# Patient Record
Sex: Male | Born: 1990 | Race: White | Hispanic: No | Marital: Single | Smoking: Never smoker
Health system: Southern US, Community
[De-identification: ages and names within clinical notes are randomized; demographics above are authoritative.]

## PROBLEM LIST (undated history)

## (undated) DIAGNOSIS — S060X9A Concussion with loss of consciousness of unspecified duration, initial encounter: Secondary | ICD-10-CM

## (undated) DIAGNOSIS — S060XAA Concussion with loss of consciousness status unknown, initial encounter: Secondary | ICD-10-CM

---

## 2014-02-13 ENCOUNTER — Emergency Department (HOSPITAL_COMMUNITY): Payer: BC Managed Care – PPO

## 2014-02-13 ENCOUNTER — Encounter (HOSPITAL_COMMUNITY): Payer: Self-pay | Admitting: Emergency Medicine

## 2014-02-13 ENCOUNTER — Emergency Department (HOSPITAL_COMMUNITY)
Admission: EM | Admit: 2014-02-13 | Discharge: 2014-02-13 | Disposition: A | Discharge status: Home / Self Care | Payer: BC Managed Care – PPO | Attending: Emergency Medicine | Admitting: Emergency Medicine

## 2014-02-13 DIAGNOSIS — S0280XA Fracture of other specified skull and facial bones, unspecified side, initial encounter for closed fracture: Secondary | ICD-10-CM | POA: Insufficient documentation

## 2014-02-13 DIAGNOSIS — W219XXA Striking against or struck by unspecified sports equipment, initial encounter: Secondary | ICD-10-CM | POA: Insufficient documentation

## 2014-02-13 DIAGNOSIS — H11421 Conjunctival edema, right eye: Secondary | ICD-10-CM

## 2014-02-13 DIAGNOSIS — S058X9A Other injuries of unspecified eye and orbit, initial encounter: Secondary | ICD-10-CM | POA: Insufficient documentation

## 2014-02-13 DIAGNOSIS — S0501XS Injury of conjunctiva and corneal abrasion without foreign body, right eye, sequela: Secondary | ICD-10-CM

## 2014-02-13 DIAGNOSIS — S0990XA Unspecified injury of head, initial encounter: Secondary | ICD-10-CM

## 2014-02-13 DIAGNOSIS — Y9362 Activity, american flag or touch football: Secondary | ICD-10-CM | POA: Insufficient documentation

## 2014-02-13 DIAGNOSIS — S0285XA Fracture of orbit, unspecified, initial encounter for closed fracture: Secondary | ICD-10-CM

## 2014-02-13 DIAGNOSIS — Y9239 Other specified sports and athletic area as the place of occurrence of the external cause: Secondary | ICD-10-CM | POA: Insufficient documentation

## 2014-02-13 DIAGNOSIS — H11429 Conjunctival edema, unspecified eye: Secondary | ICD-10-CM | POA: Insufficient documentation

## 2014-02-13 DIAGNOSIS — Y92838 Other recreation area as the place of occurrence of the external cause: Secondary | ICD-10-CM

## 2014-02-13 HISTORY — DX: Concussion with loss of consciousness status unknown, initial encounter: S06.0XAA

## 2014-02-13 HISTORY — DX: Concussion with loss of consciousness of unspecified duration, initial encounter: S06.0X9A

## 2014-02-13 MED ORDER — OXYCODONE-ACETAMINOPHEN 5-325 MG PO TABS
2.0000 | ORAL_TABLET | Freq: Once | ORAL | Status: AC
  Administered 2014-02-13: 2 via ORAL
  Filled 2014-02-13: qty 2

## 2014-02-13 MED ORDER — FLUORESCEIN SODIUM 1 MG OP STRP
1.0000 | ORAL_STRIP | Freq: Once | OPHTHALMIC | Status: DC
  Filled 2014-02-13: qty 1

## 2014-02-13 MED ORDER — TETRACAINE HCL 0.5 % OP SOLN
2.0000 [drp] | Freq: Once | OPHTHALMIC | Status: DC
  Filled 2014-02-13: qty 2

## 2014-02-13 MED ORDER — ONDANSETRON HCL 4 MG PO TABS: 4.0000 mg | ORAL_TABLET | Freq: Four times a day (QID) | ORAL | Status: AC

## 2014-02-13 MED ORDER — OXYCODONE-ACETAMINOPHEN 5-325 MG PO TABS: 1.0000 | ORAL_TABLET | ORAL | Status: AC | PRN

## 2014-02-13 MED ORDER — ERYTHROMYCIN 5 MG/GM OP OINT: TOPICAL_OINTMENT | OPHTHALMIC | Status: AC

## 2014-02-13 NOTE — Discharge Instructions (Signed)
Facial Fracture A facial fracture is a break in one of the bones of your face. HOME CARE INSTRUCTIONS   Protect the injured part of your face until it is healed.  Do not participate in activities which give chance for re-injury until your doctor approves.  Gently wash and dry your face.  Wear head and facial protection while riding a bicycle, motorcycle, or snowmobile. SEEK MEDICAL CARE IF:   An oral temperature above 102 F (38.9 C) develops.  You have severe headaches or notice changes in your vision.  You have new numbness or tingling in your face.  You develop nausea (feeling sick to your stomach), vomiting or a stiff neck. SEEK IMMEDIATE MEDICAL CARE IF:   You develop difficulty seeing or experience double vision.  You become dizzy, lightheaded, or faint.  You develop trouble speaking, breathing, or swallowing.  You have a watery discharge from your nose or ear. MAKE SURE YOU:   Understand these instructions.  Will watch your condition.  Will get help right away if you are not doing well or get worse. Document Released: 08/06/2005 Document Revised: 10/29/2011 Document Reviewed: 03/25/2008 [facility] Patient Information 2015 [place], [place]. This information is not intended to replace advice given to you by your health care provider. Make sure you discuss any questions you have with your health care provider.  Head Injury You have received a head injury. It does not appear serious at this time. Headaches and vomiting are common following head injury. It should be easy to awaken from sleeping. Sometimes it is necessary for you to stay in the emergency department for a while for observation. Sometimes admission to the hospital may be needed. After injuries such as yours, most problems occur within the first 24 hours, but side effects may occur up to 7-10 days after the injury. It is important for you to carefully monitor your condition and contact your health care provider or  seek immediate medical care if there is a change in your condition. WHAT ARE THE TYPES OF HEAD INJURIES? Head injuries can be as minor as a bump. Some head injuries can be more severe. More severe head injuries include:  A jarring injury to the brain (concussion).  A bruise of the brain (contusion). This mean there is bleeding in the brain that can cause swelling.  A cracked skull (skull fracture).  Bleeding in the brain that collects, clots, and forms a bump (hematoma). WHAT CAUSES A HEAD INJURY? A serious head injury is most likely to happen to someone who is in a car wreck and is not wearing a seat belt. Other causes of major head injuries include bicycle or motorcycle accidents, sports injuries, and falls. HOW ARE HEAD INJURIES DIAGNOSED? A complete history of the event leading to the injury and your current symptoms will be helpful in diagnosing head injuries. Many times, pictures of the brain, such as CT or MRI are needed to see the extent of the injury. Often, an overnight hospital stay is necessary for observation.  WHEN SHOULD I SEEK IMMEDIATE MEDICAL CARE?  You should get help right away if:  You have confusion or drowsiness.  You feel sick to your stomach (nauseous) or have continued, forceful vomiting.  You have dizziness or unsteadiness that is getting worse.  You have severe, continued headaches not relieved by medicine. Only take over-the-counter or prescription medicines for pain, fever, or discomfort as directed by your health care provider.  You do not have normal function of the arms or  legs or are unable to walk.  You notice changes in the black spots in the center of the colored part of your eye (pupil).  You have a clear or bloody fluid coming from your nose or ears.  You have a loss of vision. During the next 24 hours after the injury, you must stay with someone who can watch you for the warning signs. This person should contact local emergency services (911 in  the [place] if you have seizures, you become unconscious, or you are unable to wake up. HOW CAN I PREVENT A HEAD INJURY IN THE FUTURE? The most important factor for preventing major head injuries is avoiding motor vehicle accidents. To minimize the potential for damage to your head, it is crucial to wear seat belts while riding in motor vehicles. Wearing helmets while bike riding and playing collision sports (like football) is also helpful. Also, avoiding dangerous activities around the house will further help reduce your risk of head injury.  WHEN CAN I RETURN TO NORMAL ACTIVITIES AND ATHLETICS? You should be reevaluated by your health care provider before returning to these activities. If you have any of the following symptoms, you should not return to activities or contact sports until 1 week after the symptoms have stopped:  Persistent headache.  Dizziness or vertigo.  Poor attention and concentration.  Confusion.  Memory problems.  Nausea or vomiting.  Fatigue or tire easily.  Irritability.  Intolerant of bright lights or loud noises.  Anxiety or depression.  Disturbed sleep. MAKE SURE YOU:   Understand these instructions.  Will watch your condition.  Will get help right away if you are not doing well or get worse. Document Released: 08/06/2005 Document Revised: 08/11/2013 Document Reviewed: 04/13/2013 [facility] Patient Information 2015 [place], [place]. This information is not intended to replace advice given to you by your health care provider. Make sure you discuss any questions you have with your health care provider.

## 2014-02-13 NOTE — ED Notes (Signed)
Pt placed into gown and on monitor. Pt monitored by 5 lead, blood pressure, and pulse ox. Pt states he has had concussions before and he states this is worse and doesn't feel like one. Mother is at bedside and she states her son had a bloody nose on the field after the incident and wants the doctor to know.

## 2014-02-13 NOTE — ED Provider Notes (Signed)
TIME SEEN: 12:48 PM  CHIEF COMPLAINT: Head injury  HPI: Patient is a 23 y.o. M with history of prior concussions he was playing football today when he was tackled by another player and hit in the right eye. He was knocked to the ground. He states he is having headache and nausea but no vomiting. He denies any loss of consciousness. He is not on anticoagulation. He did state he had right-sided facial numbness which is resolved. No other numbness or focal weakness. He has had pain in his right eye, swelling of his right eye. No vision loss.  ROS: See HPI Constitutional: no fever  Eyes: no drainage  ENT: no runny nose   Cardiovascular:  no chest pain  Resp: no SOB  GI: no vomiting GU: no dysuria Integumentary: no rash  Allergy: no hives  Musculoskeletal: no leg swelling  Neurological: no slurred speech ROS otherwise negative  PAST MEDICAL HISTORY/PAST SURGICAL HISTORY:  Past Medical History  Diagnosis Date  . Concussion     MEDICATIONS:  Prior to Admission medications   Not on File    ALLERGIES:  No Known Allergies  SOCIAL HISTORY:  History  Substance Use Topics  . Smoking status: Never Smoker   . Smokeless tobacco: Not on file  . Alcohol Use: No    FAMILY HISTORY: No family history on file.  EXAM: BP 111/51  Pulse 80  Temp(Src) 97.9 F (36.6 C) (Oral)  Resp 18  Ht 5\' 6"  (1.676 m)  Wt 140 lb (63.504 kg)  BMI 22.61 kg/m2  SpO2 98% CONSTITUTIONAL: Alert and oriented and responds appropriately to questions. Well-appearing; well-nourished; GCS 15 HEAD: Normocephalic EYES: Patient's pupils are 3 mm and equal reactive bilaterally, patient has chemosis of the right eye, no hyphema or hypopyon, extraocular movements intact and painless, no pain with consensual light response, patient does have some photophobia of the right eye, he has right periorbital ecchymosis and swelling, no proptosis, lid patient's eye is held open he states he has normal vision, unable to assess  visual acuity as patient is unable to keep his right eye open on his own; unable to evert patient's eyelids secondary to swelling but no obvious foreign body appreciated, no globe deformity ENT: normal nose; no rhinorrhea; moist mucous membranes; pharynx without lesions noted; no dental injury; patient does have some dry blood in the right nostril with no septal hematoma NECK: Supple, no meningismus, no LAD; no midline spinal tenderness, step-off or deformity CARD: RRR; S1 and S2 appreciated; no murmurs, no clicks, no rubs, no gallops RESP: Normal chest excursion without splinting or tachypnea; breath sounds clear and equal bilaterally; no wheezes, no rhonchi, no rales; chest wall stable, nontender to palpation ABD/GI: Normal bowel sounds; non-distended; soft, non-tender, no rebound, no guarding PELVIS:  stable, nontender to palpation BACK:  The back appears normal and is non-tender to palpation, there is no CVA tenderness; no midline spinal tenderness, step-off or deformity EXT: Normal ROM in all joints; non-tender to palpation; no edema; normal capillary refill; no cyanosis    SKIN: Normal color for age and race; warm NEURO: Moves all extremities equally, sensation to light touch intact diffusely, cranial nerves II through XII intact PSYCH: The patient's mood and manner are appropriate. Grooming and personal hygiene are appropriate.  MEDICAL DECISION MAKING: Patient here for head injury. He is neurologically intact. He has equal reactive pupils with normal extraocular movements. No obvious sign of globe injury. We'll obtain CT imaging, give pain medication, check visual acuity, perform  fluorescein staining.  ED PROGRESS: CT scan shows bilateral displaced fracture of the medial wall the right orbit without entrapment of the medial rectus muscle. Otherwise head and cervical spine CTs are negative. Patient pain is improved after Percocet and he states his pain is much better after tetracaine. His  extraocular movements are still intact he still has normal vision. No proptosis. He does appear to have 2 small corneal abrasions to the 11:00 position of the right eye the iris. We'll discharge her with pain and nausea medication, erythromycin ointment. Have discussed with family they will need to follow up with ophthalmology and ENT. They are not from this area and will followup with the doctor at home. Have discussed with family that they should avoid any activity that may lead to another head injury for the next week. Have discussed strict return precautions. Have discussed rest, elevation, ice. Have discussed eating a soft diet for the next week and not blowing his nose. Patient and family verbalize understanding and are comfortable with this plan.     Layla MawKristen N Ward, DO 02/13/14 1434

## 2014-02-13 NOTE — ED Notes (Signed)
Pt playing flag football. Family reports unable to tell if pt was hit in eye with a finger or something else. Pt presents with black eye swollen shut. Pt also c/o headache. Pt denies N/V. Family denies +LOC.

## 2015-04-08 IMAGING — CT CT MAXILLOFACIAL W/O CM
2 of 7 series · 8 of 47 positions shown, 10 images · non-contrast
Comparison: None.

CLINICAL DATA: Right orbital hematoma post injury football game

EXAM:
CT HEAD WITHOUT CONTRAST
CT MAXILLOFACIAL WITHOUT CONTRAST
CT CERVICAL SPINE WITHOUT CONTRAST
TECHNIQUE: Multidetector CT imaging of the head, cervical spine, and
maxillofacial structures were performed using the standard protocol
without intravenous contrast. Multiplanar CT image reconstructions
of the cervical spine and maxillofacial structures were also
generated.

[Series 408: coronal bone c-spine · coronal · 0.21mm/px · 3 of 88 slices shown]
[im 30/88  bone]
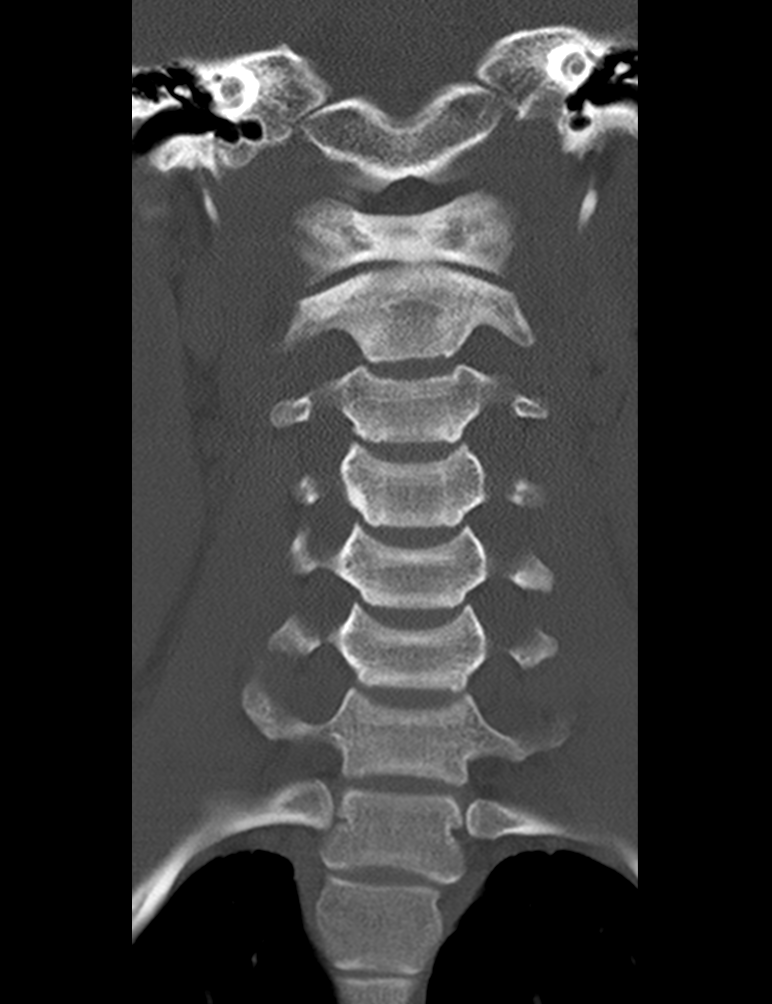
[im 39/88  bone]
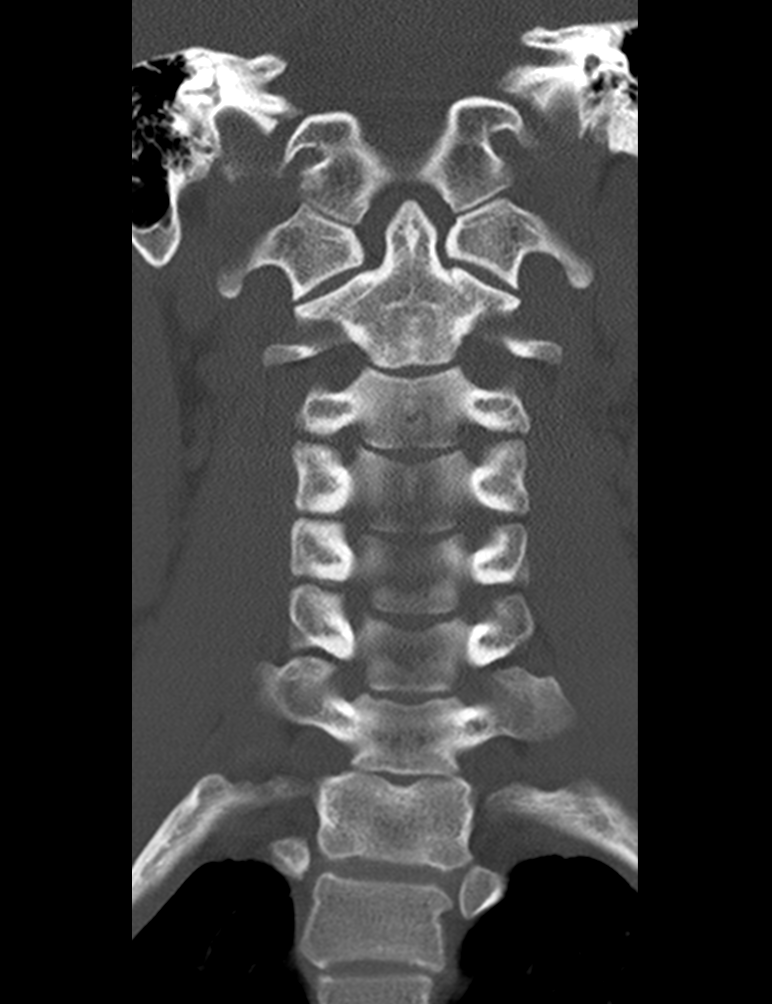
[im 49/88  bone]
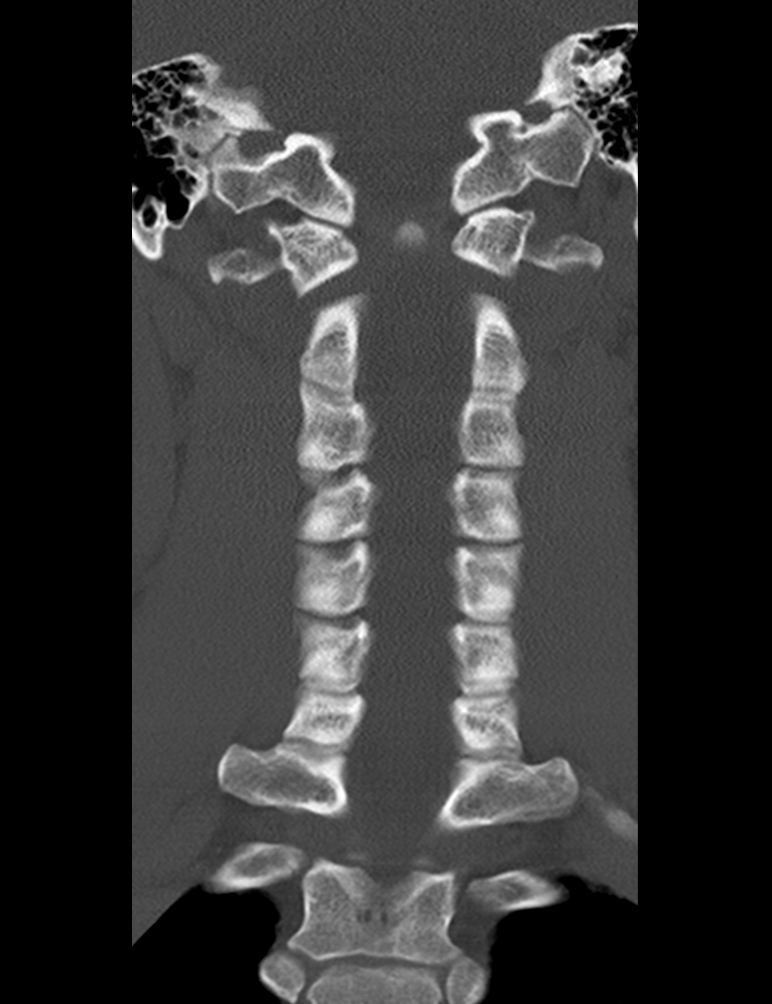

[Series 409: orthogonal · axial · 0.21mm/px · z∈[+289,+290]mm · 5 of 84 slices shown, 7 images]
[im 14/84  brain]
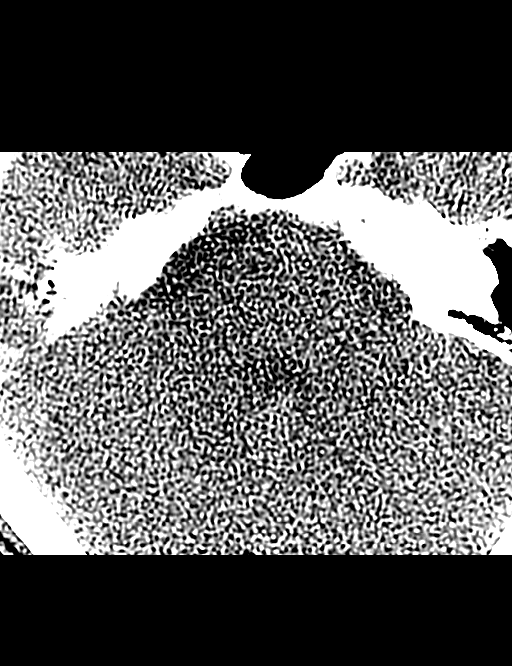
[im 14/84  bone]
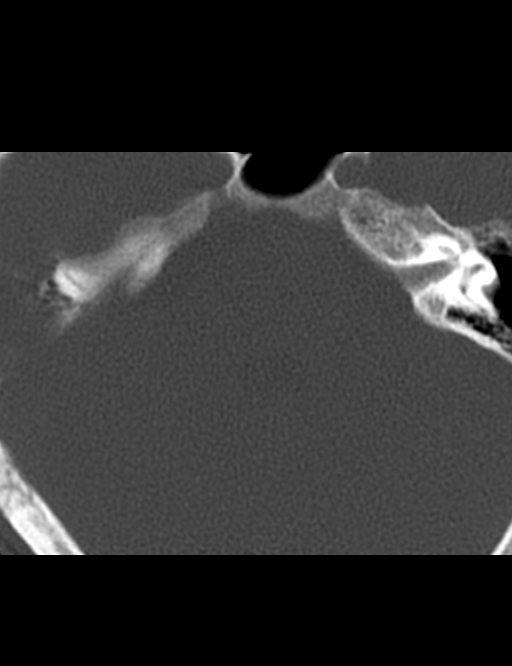
[im 28/84  bone]
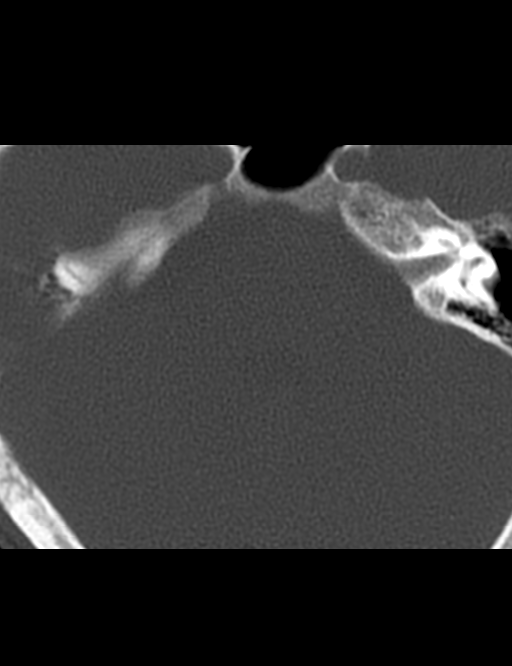
[im 42/84  bone]
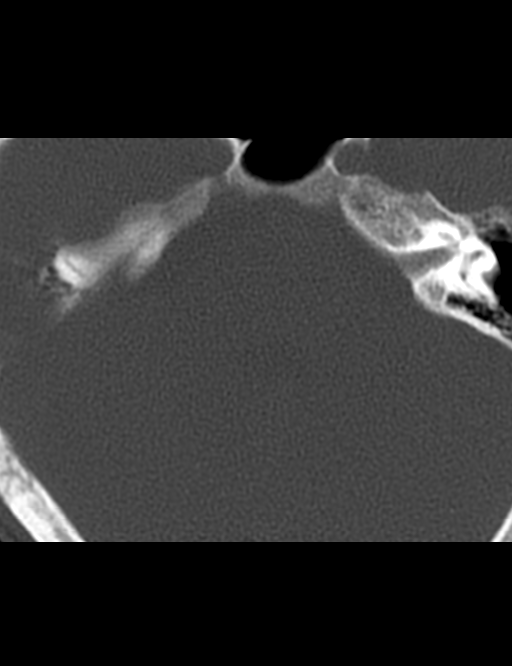
[im 56/84  bone]
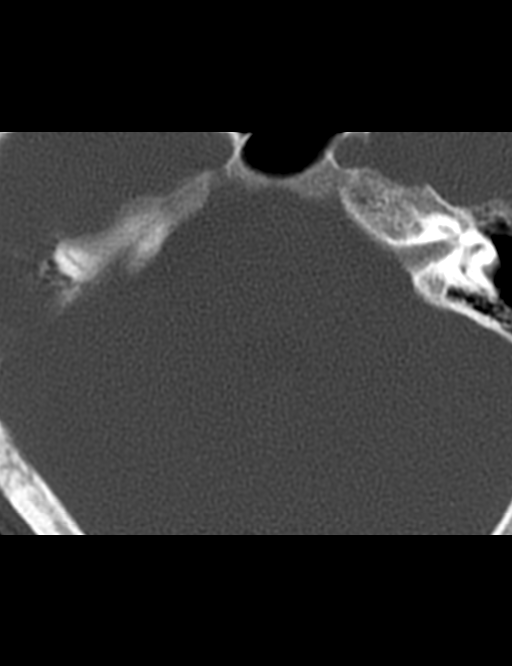
[im 70/84  brain]
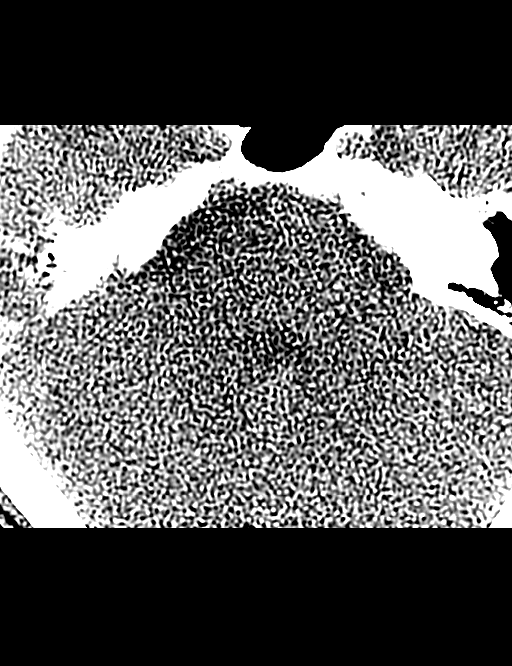
[im 70/84  bone]
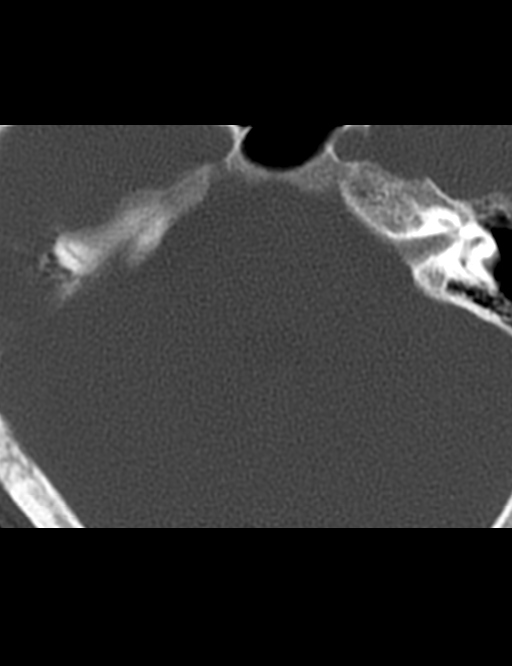

[8 of 47 positions shown; findings below may reference images not displayed]

FINDINGS: CT HEAD FINDINGS

There is a fracture of medial right orbital wall. Fluid and partial
opacification noted right ethmoid air cells and right nasal cavity.

No intracranial hemorrhage, mass effect or midline shift.

No skull fracture is noted. Paranasal sinuses and mastoid air cells
are unremarkable.

CT MAXILLOFACIAL FINDINGS

No orbital floor fracture is noted on coronal images. There is a
blowout fracture of medial right orbital wall lamina papyracea.
There is partial fat herniation and partial entrapment of the medial
aspect of medial rectus muscle. Best seen in coronal image 27. Small
amount of intraorbital air is noted medially same image. There is
right preorbital soft tissue swelling. Bilateral eye globe is
symmetrical in appearance. Small amount of air is noted just
anteromedial to right eye globe axial image 11.

No large retro-orbital hematoma. There is right nasal septum
deviation.

No zygomatic fracture is noted.

Sagittal images shows no maxillary spine fracture. No nasal bone
fracture is noted.

The nasopharyngeal and oropharyngeal airway is patent.

No mandibular fracture is noted.  No TMJ dislocation.

CT CERVICAL SPINE FINDINGS

Axial images of the cervical spine shows no acute fracture or
subluxation. Computer processed images shows alignment, disc spaces
and vertebral height preserved.
IMPRESSION: 1. No acute intracranial abnormality.
2. There is a blowout mild displaced fracture of medial wall of the
right orbit with mild entrapment of the medial rectus muscle. There
is fluid and partial opacification right nasal cavity and right
ethmoid air cells. Right nasal septum deviation is noted.
3. No orbital floor fracture is noted. No mandibular fracture. No
nasal bone fracture. Small amount of intraorbital air is noted right
side posteromedially.
4. There is soft tissue swelling right periorbital and preorbital.
No zygomatic fracture.
5. No cervical spine acute fracture or subluxation.
